# Patient Record
Sex: Female | Born: 2000 | Race: White | Hispanic: No | Marital: Single
Health system: Southern US, Community
[De-identification: ages and names within clinical notes are randomized; demographics above are authoritative.]

## PROBLEM LIST (undated history)

## (undated) DIAGNOSIS — J302 Other seasonal allergic rhinitis: Secondary | ICD-10-CM

## (undated) HISTORY — PX: ADENOIDECTOMY: SUR15

---
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
## 2021-01-12 DIAGNOSIS — Z419 Encounter for procedure for purposes other than remedying health state, unspecified: Secondary | ICD-10-CM | POA: Diagnosis not present

## 2021-02-11 DIAGNOSIS — Z419 Encounter for procedure for purposes other than remedying health state, unspecified: Secondary | ICD-10-CM | POA: Diagnosis not present

## 2021-03-14 DIAGNOSIS — Z419 Encounter for procedure for purposes other than remedying health state, unspecified: Secondary | ICD-10-CM | POA: Diagnosis not present

## 2021-04-14 DIAGNOSIS — Z419 Encounter for procedure for purposes other than remedying health state, unspecified: Secondary | ICD-10-CM | POA: Diagnosis not present

## 2021-05-14 DIAGNOSIS — Z419 Encounter for procedure for purposes other than remedying health state, unspecified: Secondary | ICD-10-CM | POA: Diagnosis not present

## 2021-06-14 DIAGNOSIS — Z419 Encounter for procedure for purposes other than remedying health state, unspecified: Secondary | ICD-10-CM | POA: Diagnosis not present

## 2021-07-14 DIAGNOSIS — Z419 Encounter for procedure for purposes other than remedying health state, unspecified: Secondary | ICD-10-CM | POA: Diagnosis not present

## 2021-08-14 DIAGNOSIS — Z419 Encounter for procedure for purposes other than remedying health state, unspecified: Secondary | ICD-10-CM | POA: Diagnosis not present

## 2021-09-14 DIAGNOSIS — Z419 Encounter for procedure for purposes other than remedying health state, unspecified: Secondary | ICD-10-CM | POA: Diagnosis not present

## 2021-10-12 DIAGNOSIS — Z419 Encounter for procedure for purposes other than remedying health state, unspecified: Secondary | ICD-10-CM | POA: Diagnosis not present

## 2021-11-12 DIAGNOSIS — Z419 Encounter for procedure for purposes other than remedying health state, unspecified: Secondary | ICD-10-CM | POA: Diagnosis not present

## 2021-12-12 DIAGNOSIS — Z419 Encounter for procedure for purposes other than remedying health state, unspecified: Secondary | ICD-10-CM | POA: Diagnosis not present

## 2022-01-12 DIAGNOSIS — Z419 Encounter for procedure for purposes other than remedying health state, unspecified: Secondary | ICD-10-CM | POA: Diagnosis not present

## 2022-02-11 DIAGNOSIS — Z419 Encounter for procedure for purposes other than remedying health state, unspecified: Secondary | ICD-10-CM | POA: Diagnosis not present

## 2022-03-14 DIAGNOSIS — Z419 Encounter for procedure for purposes other than remedying health state, unspecified: Secondary | ICD-10-CM | POA: Diagnosis not present

## 2022-04-14 DIAGNOSIS — Z419 Encounter for procedure for purposes other than remedying health state, unspecified: Secondary | ICD-10-CM | POA: Diagnosis not present

## 2022-05-14 DIAGNOSIS — Z419 Encounter for procedure for purposes other than remedying health state, unspecified: Secondary | ICD-10-CM | POA: Diagnosis not present

## 2022-06-14 DIAGNOSIS — Z419 Encounter for procedure for purposes other than remedying health state, unspecified: Secondary | ICD-10-CM | POA: Diagnosis not present

## 2022-07-14 DIAGNOSIS — Z419 Encounter for procedure for purposes other than remedying health state, unspecified: Secondary | ICD-10-CM | POA: Diagnosis not present

## 2022-08-14 DIAGNOSIS — Z419 Encounter for procedure for purposes other than remedying health state, unspecified: Secondary | ICD-10-CM | POA: Diagnosis not present

## 2022-09-14 DIAGNOSIS — Z419 Encounter for procedure for purposes other than remedying health state, unspecified: Secondary | ICD-10-CM | POA: Diagnosis not present

## 2022-10-13 DIAGNOSIS — Z419 Encounter for procedure for purposes other than remedying health state, unspecified: Secondary | ICD-10-CM | POA: Diagnosis not present

## 2022-11-13 DIAGNOSIS — Z419 Encounter for procedure for purposes other than remedying health state, unspecified: Secondary | ICD-10-CM | POA: Diagnosis not present

## 2022-12-13 DIAGNOSIS — Z419 Encounter for procedure for purposes other than remedying health state, unspecified: Secondary | ICD-10-CM | POA: Diagnosis not present

## 2023-01-13 DIAGNOSIS — Z419 Encounter for procedure for purposes other than remedying health state, unspecified: Secondary | ICD-10-CM | POA: Diagnosis not present

## 2023-02-12 DIAGNOSIS — Z419 Encounter for procedure for purposes other than remedying health state, unspecified: Secondary | ICD-10-CM | POA: Diagnosis not present

## 2023-03-15 DIAGNOSIS — Z419 Encounter for procedure for purposes other than remedying health state, unspecified: Secondary | ICD-10-CM | POA: Diagnosis not present

## 2023-04-15 DIAGNOSIS — Z419 Encounter for procedure for purposes other than remedying health state, unspecified: Secondary | ICD-10-CM | POA: Diagnosis not present

## 2023-05-15 DIAGNOSIS — Z419 Encounter for procedure for purposes other than remedying health state, unspecified: Secondary | ICD-10-CM | POA: Diagnosis not present
